# Patient Record
Sex: Female | Born: 1935 | Race: White | Hispanic: No | Smoking: Never smoker
Health system: Southern US, Community
[De-identification: ages and names within clinical notes are randomized; demographics above are authoritative.]

## PROBLEM LIST (undated history)

## (undated) DIAGNOSIS — F0280 Dementia in other diseases classified elsewhere without behavioral disturbance: Secondary | ICD-10-CM

## (undated) DIAGNOSIS — G309 Alzheimer's disease, unspecified: Secondary | ICD-10-CM

## (undated) DIAGNOSIS — I10 Essential (primary) hypertension: Secondary | ICD-10-CM

## (undated) HISTORY — PX: HIP SURGERY: SHX245

## (undated) HISTORY — PX: KNEE SURGERY: SHX244

---

## 2011-03-10 DEATH — deceased

## 2015-08-30 ENCOUNTER — Emergency Department (HOSPITAL_BASED_OUTPATIENT_CLINIC_OR_DEPARTMENT_OTHER): Payer: Medicare Other

## 2015-08-30 ENCOUNTER — Emergency Department (HOSPITAL_BASED_OUTPATIENT_CLINIC_OR_DEPARTMENT_OTHER)
Admission: EM | Admit: 2015-08-30 | Discharge: 2015-08-30 | Disposition: A | Discharge status: Home / Self Care | Payer: Medicare Other | Attending: Emergency Medicine | Admitting: Emergency Medicine

## 2015-08-30 ENCOUNTER — Encounter (HOSPITAL_BASED_OUTPATIENT_CLINIC_OR_DEPARTMENT_OTHER): Payer: Self-pay

## 2015-08-30 DIAGNOSIS — Z79899 Other long term (current) drug therapy: Secondary | ICD-10-CM | POA: Insufficient documentation

## 2015-08-30 DIAGNOSIS — S79921A Unspecified injury of right thigh, initial encounter: Secondary | ICD-10-CM | POA: Insufficient documentation

## 2015-08-30 DIAGNOSIS — Y9389 Activity, other specified: Secondary | ICD-10-CM | POA: Insufficient documentation

## 2015-08-30 DIAGNOSIS — G309 Alzheimer's disease, unspecified: Secondary | ICD-10-CM | POA: Insufficient documentation

## 2015-08-30 DIAGNOSIS — Z7982 Long term (current) use of aspirin: Secondary | ICD-10-CM | POA: Diagnosis not present

## 2015-08-30 DIAGNOSIS — W19XXXA Unspecified fall, initial encounter: Secondary | ICD-10-CM

## 2015-08-30 DIAGNOSIS — W1839XA Other fall on same level, initial encounter: Secondary | ICD-10-CM | POA: Diagnosis not present

## 2015-08-30 DIAGNOSIS — Y9289 Other specified places as the place of occurrence of the external cause: Secondary | ICD-10-CM | POA: Diagnosis not present

## 2015-08-30 DIAGNOSIS — Y998 Other external cause status: Secondary | ICD-10-CM | POA: Insufficient documentation

## 2015-08-30 DIAGNOSIS — S59911A Unspecified injury of right forearm, initial encounter: Secondary | ICD-10-CM | POA: Diagnosis present

## 2015-08-30 DIAGNOSIS — I10 Essential (primary) hypertension: Secondary | ICD-10-CM | POA: Insufficient documentation

## 2015-08-30 HISTORY — DX: Essential (primary) hypertension: I10

## 2015-08-30 HISTORY — DX: Dementia in other diseases classified elsewhere, unspecified severity, without behavioral disturbance, psychotic disturbance, mood disturbance, and anxiety: F02.80

## 2015-08-30 HISTORY — DX: Alzheimer's disease, unspecified: G30.9

## 2015-08-30 NOTE — ED Provider Notes (Signed)
CSN: [id number]     Arrival date & time 08/30/15  1135 History   First MD Initiated Contact with Patient 08/30/15 1203     Chief Complaint  Patient presents with  . Fall     (Consider location/radiation/quality/duration/timing/severity/associated sxs/prior Treatment) HPI Patient presents with caregiver who assists with the history of present illness. The patient has dementia, level V caveat. It seems as though the patient had a mechanical fall 3 days ago. Since that time she has had pain persistently in the right forearm, right mid thigh. No left-sided pain. No loss of consciousness at the time, nor change in interactivity since the fall. Patient denies any head pain, neck pain, states that the pain in her arm and leg are mild, persistent, sore. No medication taken for pain relief thus far.  Past Medical History  Diagnosis Date  . Alzheimer disease   . Hypertension    Past Surgical History  Procedure Laterality Date  . Hip surgery    . Knee surgery     No family history on file. Social History  Substance Use Topics  . Smoking status: Never Smoker   . Smokeless tobacco: None  . Alcohol Use: No   OB History    No data available     Review of Systems  Unable to perform ROS: Dementia      Allergies  Review of patient's allergies indicates no known allergies.  Home Medications   Prior to Admission medications   Medication Sig Start Date End Date Taking? Authorizing Provider  amLODipine (NORVASC) 2.5 MG tablet Take 2.5 mg by mouth daily.   Yes Historical Provider, MD  aspirin 325 MG tablet Take 325 mg by mouth daily.   Yes Historical Provider, MD  calcium carbonate (OSCAL) 1500 (600 CA) MG TABS tablet Take by mouth 2 (two) times daily with a meal.   Yes Historical Provider, MD  risperiDONE (RISPERDAL) 0.5 MG tablet Take 0.5 mg by mouth at bedtime.   Yes Historical Provider, MD   BP 109/60 mmHg  Pulse 68  Temp(Src) 97.6 F (36.4 C) (Oral)  Resp 18  Ht 5\' 4"   (1.626 m)  Wt 120 lb (54.432 kg)  BMI 20.59 kg/m2  SpO2 100% Physical Exam  Constitutional: She appears well-developed and well-nourished. No distress.  HENT:  Head: Normocephalic and atraumatic.  Eyes: Conjunctivae and EOM are normal.  Cardiovascular: Normal rate and regular rhythm.   Pulmonary/Chest: Effort normal and breath sounds normal. No stridor. No respiratory distress.  Abdominal: She exhibits no distension.  Musculoskeletal: She exhibits no edema.       Right elbow: Normal.      Right wrist: Normal.       Right hip: Normal.       Arms:      Legs: Neurological: She is alert. She displays atrophy. She displays no tremor. No cranial nerve deficit. She exhibits normal muscle tone. She displays no seizure activity.  Skin: Skin is warm and dry.  Psychiatric: Her speech is delayed. She is slowed and withdrawn. Cognition and memory are impaired.  Nursing note and vitals reviewed.   ED Course  Procedures (including critical care time) Labs Review Labs Reviewed - No data to display  Imaging Review Dg Forearm Right  08/30/2015  CLINICAL DATA:  Larey SeatFell 4 days ago.  Generalized pain. EXAM: RIGHT FOREARM - 2 VIEW COMPARISON:  None. FINDINGS: There is no evidence of fracture or other focal bone lesions. Soft tissues are unremarkable. IMPRESSION: Negative. Electronically Signed  By: Paulina Fusi M.D.   On: 08/30/2015 13:25   Dg Knee Complete 4 Views Right  08/30/2015  CLINICAL DATA:  Pain following fall 4 days prior EXAM: RIGHT KNEE - COMPLETE 4+ VIEW COMPARISON:  None. FINDINGS: Frontal, lateral, and bilateral oblique views were obtained. Patient is status post total knee replacement with the prosthetic components appearing well-seated. No acute fracture or dislocation. No appreciable joint effusion. 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 There is also scattered atherosclerotic vascular calcification present. IMPRESSION: Status post total knee replacement with  prosthetic components appearing well-seated. No acute fracture or dislocation. No joint effusion. 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 Electronically Signed   By: Bretta Bang III M.D.   On: 08/30/2015 13:28   Dg Hip Unilat With Pelvis 2-3 Views Right  08/30/2015  CLINICAL DATA:  Larey Seat 4 days ago with right hip pain. EXAM: DG HIP (WITH OR WITHOUT PELVIS) 2-3V RIGHT COMPARISON:  None. FINDINGS: Previous total hip replacement on the right. Components appear well positioned. No complications seen relating to that. The other bones of the pelvis do not show any acute injury. There is some osteoarthritis of the left hip. IMPRESSION: No acute or traumatic finding. Previous hip replacement on the right. Some osteoarthritis of the left hip. Electronically Signed   By: Paulina Fusi M.D.   On: 08/30/2015 13:26   I have personally reviewed and evaluated these images and lab results as part of my medical decision-making.  On repeat exam the patient is in no distress. I discussed all findings with patient and her caregiver.  MDM  Impression: Several days after a fall. Patient has had pain in multiple areas, but no appreciable deformity, and x-rays do not demonstrate fracture. Patient is hemodynamically stable, interacting at baseline per Patient discharged in stable condition to follow-up with primary care.  Gerhard Munch, MD 08/30/15 [phone]

## 2015-08-30 NOTE — ED Notes (Signed)
Lost balance and fell on Saturday-pain to right leg and right arm-pt presents to triage in w/c

## 2015-08-30 NOTE — Discharge Instructions (Signed)
As discussed, your evaluation today has been largely reassuring.  But, it is important that you monitor your condition carefully, and do not hesitate to return to the ED if you develop new, or concerning changes in your condition. ? ?Otherwise, please follow-up with your physician for appropriate ongoing care. ? ?

## 2016-01-10 ENCOUNTER — Emergency Department (HOSPITAL_BASED_OUTPATIENT_CLINIC_OR_DEPARTMENT_OTHER): Payer: Medicare Other

## 2016-01-10 ENCOUNTER — Encounter (HOSPITAL_BASED_OUTPATIENT_CLINIC_OR_DEPARTMENT_OTHER): Payer: Self-pay | Admitting: Emergency Medicine

## 2016-01-10 ENCOUNTER — Emergency Department (HOSPITAL_BASED_OUTPATIENT_CLINIC_OR_DEPARTMENT_OTHER)
Admission: EM | Admit: 2016-01-10 | Discharge: 2016-01-10 | Disposition: A | Discharge status: Home / Self Care | Payer: Medicare Other | Attending: Emergency Medicine | Admitting: Emergency Medicine

## 2016-01-10 DIAGNOSIS — N390 Urinary tract infection, site not specified: Secondary | ICD-10-CM | POA: Diagnosis not present

## 2016-01-10 DIAGNOSIS — R4182 Altered mental status, unspecified: Secondary | ICD-10-CM | POA: Diagnosis present

## 2016-01-10 DIAGNOSIS — Z7982 Long term (current) use of aspirin: Secondary | ICD-10-CM | POA: Insufficient documentation

## 2016-01-10 DIAGNOSIS — G309 Alzheimer's disease, unspecified: Secondary | ICD-10-CM | POA: Diagnosis not present

## 2016-01-10 DIAGNOSIS — Z79899 Other long term (current) drug therapy: Secondary | ICD-10-CM | POA: Insufficient documentation

## 2016-01-10 DIAGNOSIS — I10 Essential (primary) hypertension: Secondary | ICD-10-CM | POA: Insufficient documentation

## 2016-01-10 DIAGNOSIS — L03115 Cellulitis of right lower limb: Secondary | ICD-10-CM | POA: Diagnosis not present

## 2016-01-10 DIAGNOSIS — F0280 Dementia in other diseases classified elsewhere without behavioral disturbance: Secondary | ICD-10-CM | POA: Insufficient documentation

## 2016-01-10 LAB — CBC WITH DIFFERENTIAL/PLATELET
Basophils Absolute: 0 10*3/uL (ref 0.0–0.1)
Basophils Relative: 1 %
EOS ABS: 0.2 10*3/uL (ref 0.0–0.7)
EOS PCT: 4 %
HCT: 31.2 % — ABNORMAL LOW (ref 36.0–46.0)
HEMOGLOBIN: 10.4 g/dL — AB (ref 12.0–15.0)
LYMPHS ABS: 1.3 10*3/uL (ref 0.7–4.0)
LYMPHS PCT: 25 %
MCH: 30.1 pg (ref 26.0–34.0)
MCHC: 33.3 g/dL (ref 30.0–36.0)
MCV: 90.4 fL (ref 78.0–100.0)
MONOS PCT: 14 %
Monocytes Absolute: 0.8 10*3/uL (ref 0.1–1.0)
NEUTROS PCT: 56 %
Neutro Abs: 3 10*3/uL (ref 1.7–7.7)
Platelets: 329 10*3/uL (ref 150–400)
RBC: 3.45 MIL/uL — ABNORMAL LOW (ref 3.87–5.11)
RDW: 16.7 % — ABNORMAL HIGH (ref 11.5–15.5)
WBC: 5.4 10*3/uL (ref 4.0–10.5)

## 2016-01-10 LAB — COMPREHENSIVE METABOLIC PANEL
ALT: 27 U/L (ref 14–54)
AST: 23 U/L (ref 15–41)
Albumin: 3.3 g/dL — ABNORMAL LOW (ref 3.5–5.0)
Alkaline Phosphatase: 79 U/L (ref 38–126)
Anion gap: 7 (ref 5–15)
BUN: 21 mg/dL — AB (ref 6–20)
CHLORIDE: 104 mmol/L (ref 101–111)
CO2: 25 mmol/L (ref 22–32)
CREATININE: 0.52 mg/dL (ref 0.44–1.00)
Calcium: 8.8 mg/dL — ABNORMAL LOW (ref 8.9–10.3)
GFR calc Af Amer: >60 mL/min (ref >60)
GFR calc non Af Amer: >60 mL/min (ref >60)
Glucose, Bld: 109 mg/dL — ABNORMAL HIGH (ref 65–99)
POTASSIUM: 4 mmol/L (ref 3.5–5.1)
SODIUM: 136 mmol/L (ref 135–145)
Total Bilirubin: 0.4 mg/dL (ref 0.3–1.2)
Total Protein: 6.2 g/dL — ABNORMAL LOW (ref 6.5–8.1)

## 2016-01-10 LAB — URINALYSIS, ROUTINE W REFLEX MICROSCOPIC
Bilirubin Urine: NEGATIVE
Glucose, UA: NEGATIVE mg/dL
Hgb urine dipstick: NEGATIVE
Ketones, ur: NEGATIVE mg/dL
NITRITE: POSITIVE — AB
PROTEIN: NEGATIVE mg/dL
Specific Gravity, Urine: 1.022 (ref 1.005–1.030)
pH: 6 (ref 5.0–8.0)

## 2016-01-10 LAB — URINE MICROSCOPIC-ADD ON

## 2016-01-10 MED ORDER — CEFTRIAXONE SODIUM 1 G IJ SOLR: 1.0000 g | Freq: Once | INTRAMUSCULAR | Status: DC

## 2016-01-10 MED ORDER — DEXTROSE 5 % IV SOLN
1.0000 g | Freq: Once | INTRAVENOUS | Status: AC
  Administered 2016-01-10: 1 g via INTRAVENOUS
  Filled 2016-01-10: qty 10

## 2016-01-10 MED ORDER — CEPHALEXIN 500 MG PO CAPS: 500.0000 mg | ORAL_CAPSULE | Freq: Two times a day (BID) | ORAL | Status: AC

## 2016-01-10 NOTE — ED Notes (Signed)
Attempts x3 to call report [facility] at [phone] with no answer call to second [facility] and message with return number to call left with Bonita QuinLinda who stated she would continue to call [facility] and have them return call to [facility] at [phone].

## 2016-01-10 NOTE — ED Notes (Signed)
[facility] contacted and report given.

## 2016-01-10 NOTE — ED Notes (Signed)
Pt brought to ED via EMS from NH for R/O UTI. Has had increased confusion x 3 days. Has been combative at times per NH staff and has had increased falls. Presents responding approp to some questions, but not oriented to date and time. Oriented to birthday and age and some of her hx. Bruising noted to left hip and lower ext. Abrasions to both LE. Approx 3 cm healing wound to RLE. And two approx 2 cm healing wounds to LLE.

## 2016-01-10 NOTE — ED Notes (Signed)
While getting vitals patient is confused and not following directions. Patient started to take temperature then pushed thermometer out of mouth and states that is enough of that.

## 2016-01-10 NOTE — ED Notes (Signed)
Per EMS:  Pt at Nursing facility.   Staff states pt has increase in confusion, some combativeness and increase in falls over the last three days.  Staff requesting assessment for UTI evaluation.  Pt has several skin tears from her recent falls.  Pt argumentative, refusing care.

## 2016-01-10 NOTE — ED Provider Notes (Signed)
CSN: [id number]     Arrival date & time 01/10/16  1829 History  By signing my name below, I, Linna Darner, attest that this documentation has been prepared under the direction and in the presence of physician practitioner, Pricilla Loveless, MD. Electronically Signed: Linna Darner, Scribe. 01/10/2016. 6:50 PM.    Chief Complaint  Patient presents with  . Altered Mental Status    The history is provided by the patient and the EMS personnel. No language interpreter was used.     HPI Comments: LEVEL 5 CAVEAT DUE TO DEMENTIA Alyssa Turner is a 82 y.o. female brought in by EMS with h/o Alzheimer's who presents to the Emergency Department with concern for increased confusion and falls over the last three days. Per EMS, pt was sent to the ER today for a UTI evaluation after growing concern over her increased recent falls at her nursing home. Per EMS, pt has multiple skin tears and abrasions from her recent falls. She endorses mild lower leg pain bilaterally. Pt denies abdominal pain, neck pain, or any other associated symptoms.  Past Medical History  Diagnosis Date  . Alzheimer disease   . Hypertension    Past Surgical History  Procedure Laterality Date  . Hip surgery    . Knee surgery     No family history on file. Social History  Substance Use Topics  . Smoking status: Never Smoker   . Smokeless tobacco: None  . Alcohol Use: No   OB History    No data available     Review of Systems  Unable to perform ROS: Dementia  Musculoskeletal: Positive for arthralgias. Negative for neck pain.  All other systems reviewed and are negative.  Allergies  Review of patient's allergies indicates no known allergies.  Home Medications   Prior to Admission medications   Medication Sig Start Date End Date Taking? Authorizing Provider  amLODipine (NORVASC) 2.5 MG tablet Take 2.5 mg by mouth daily.   Yes Historical Provider, MD  aspirin 325 MG tablet Take 325 mg by mouth daily.   Yes Historical  Provider, MD  Calcium Carbonate-Vit D-Min (CALTRATE 600+D PLUS MINERALS) 600-800 MG-UNIT CHEW Chew 1 tablet by mouth 2 (two) times daily.   Yes Historical Provider, MD  diphenhydrAMINE (BENADRYL) 25 MG tablet Take 50 mg by mouth at bedtime as needed.   Yes Historical Provider, MD  donepezil (ARICEPT) 10 MG tablet Take 10 mg by mouth daily.   Yes Historical Provider, MD  ibuprofen (ADVIL,MOTRIN) 200 MG tablet Take 400 mg by mouth 2 (two) times daily as needed.   Yes Historical Provider, MD  loperamide (IMODIUM A-D) 2 MG tablet Take 2 mg by mouth 4 (four) times daily as needed for diarrhea or loose stools.   Yes Historical Provider, MD  Multiple Vitamins-Minerals (ICAPS MV) TABS Take 1 tablet by mouth daily.   Yes Historical Provider, MD  neomycin-bacitracin-polymyxin (NEOSPORIN) [phone] ointment Apply 1 application topically 4 (four) times daily.   Yes Historical Provider, MD   BP 126/62 mmHg  Pulse 83  Temp(Src)   Resp 18  SpO2 100% Physical Exam  Constitutional: She appears well-developed and well-nourished.  HENT:  Head: Normocephalic and atraumatic.  Right Ear: External ear normal.  Left Ear: External ear normal.  Nose: Nose normal.  Eyes: Right eye exhibits no discharge. Left eye exhibits no discharge.  Cardiovascular: Normal rate, regular rhythm and normal heart sounds.   Pulmonary/Chest: Effort normal and breath sounds normal.  Abdominal: Soft. There is no tenderness.  Musculoskeletal:  Ecchymosis over left proximal lateral leg with tenderness. Multiple bruises and small abrasions to bilateral lower extremities. RLE: area of redness, warmth, and superficial wound to the lower leg; no drainage.  Neurological: She is alert.  Disoriented. Awake and alert to self and place. 5/5 strength in all four extremities.  Skin: Skin is warm and dry.  Nursing note and vitals reviewed.   ED Course  Procedures (including critical care time)  DIAGNOSTIC STUDIES: Oxygen Saturation is  100% on RA, normal by my interpretation.    COORDINATION OF CARE: 6:50 PM Discussed treatment plan with pt at bedside and pt agreed to plan.  Labs Review Labs Reviewed  URINALYSIS, ROUTINE W REFLEX MICROSCOPIC (NOT AT [facility]) - Abnormal; Notable for the following:    APPearance CLOUDY (*)    Nitrite POSITIVE (*)    Leukocytes, UA SMALL (*)    All other components within normal limits  COMPREHENSIVE METABOLIC PANEL - Abnormal; Notable for the following:    Glucose, Bld 109 (*)    BUN 21 (*)    Calcium 8.8 (*)    Total Protein 6.2 (*)    Albumin 3.3 (*)    All other components within normal limits  CBC WITH DIFFERENTIAL/PLATELET - Abnormal; Notable for the following:    RBC 3.45 (*)    Hemoglobin 10.4 (*)    HCT 31.2 (*)    RDW 16.7 (*)    All other components within normal limits  URINE MICROSCOPIC-ADD ON - Abnormal; Notable for the following:    Squamous Epithelial / LPF 0-5 (*)    Bacteria, UA MANY (*)    All other components within normal limits  URINE CULTURE    Imaging Review Dg Chest 2 View  01/10/2016  CLINICAL DATA:  82 year old with current history of Alzheimer's dementia presenting with worsening confusion and multiple falls over the past 3 days. EXAM: CHEST  2 VIEW COMPARISON:  None. FINDINGS: Cardiac silhouette moderately enlarged. Thoracic aorta atherosclerotic. Large hiatal hernia. Lungs clear. Bronchovascular markings normal. Pulmonary vascularity normal. No visible pleural effusions. No pneumothorax. Degenerative changes and DISH involving the thoracic spine. Old healed right rib fractures. Severe degenerative changes involving the visualized shoulder joints. IMPRESSION: Cardiomegaly. No acute cardiopulmonary disease. Large hiatal hernia. Electronically Signed   By: Hulan Saashomas  Lawrence M.D.   On: 01/10/2016 20:53   Ct Head Wo Contrast  01/10/2016  CLINICAL DATA:  82 year old with current history of Alzheimer's dementia presenting with 3 day history of worsening confusion  multiple falls. EXAM: CT HEAD WITHOUT CONTRAST TECHNIQUE: Contiguous axial images were obtained from the base of the skull through the vertex without intravenous contrast. COMPARISON:  None. FINDINGS: Patient motion blurred acute the images but the study appears diagnostic. Severe cortical, deep and cerebellar atrophy. Moderate changes of small vessel disease of the white matter diffusely. No mass lesion. No midline shift. No acute hemorrhage or hematoma. No extra-axial fluid collections. No evidence of acute infarction. No skull fracture or other focal osseous abnormality involving the skull. Visualized paranasal sinuses, bilateral mastoid air cells and bilateral middle ear cavities well-aerated. Moderate bilateral carotid siphon atherosclerosis. IMPRESSION: 1. No acute intracranial abnormality. 2. Severe generalized atrophy and moderate chronic microvascular ischemic changes of the white matter. Electronically Signed   By: Hulan Saashomas  Lawrence M.D.   On: 01/10/2016 20:55   Dg Hip Unilat With Pelvis 2-3 Views Left  01/10/2016  CLINICAL DATA:  Left hip injury and urinary tract infection. Several falls over the past couple days. EXAM:  DG HIP (WITH OR WITHOUT PELVIS) 2-3V LEFT COMPARISON:  None. FINDINGS: Osteopenia. No definite displaced left hip fracture. Moderate degenerative change of the left hip with joint space loss, subchondral sclerosis and osteophytosis. Limited visualization of the adjacent pelvis is normal. Post right total hip replacement without evidence of hardware failure or loosening, though note, the femoral stem component tip is not imaged. Suspected at least moderate degenerative change of the lower lumbar spine. Vascular calcifications.  No radiopaque foreign body. IMPRESSION: 1. Osteopenia without definite displaced left hip fracture. 2. Moderate degenerate change of the left hip. Electronically Signed   By: Simonne Come M.D.   On: 01/10/2016 20:54   I have personally reviewed and evaluated these  images and lab results as part of my medical decision-making.   EKG Interpretation   Date/Time:  Wednesday Jan 10 2016 19:18:49 EDT Ventricular Rate:  71 PR Interval:  69 QRS Duration: 100 QT Interval:  417 QTC Calculation: 453 R Axis:     Text Interpretation:  Normal sinus rhythm wandering baseline limits  interpretation No old tracing to compare Confirmed by Quinnton Bury MD, Shaymus Eveleth  [phone]) on 01/10/2016 10:36:30 PM      MDM   Final diagnoses:  UTI (lower urinary tract infection)  Cellulitis of right leg    Patient vital signs are unremarkable here including no fever. When describing the patient's mental status to her son Trishelle Devora) over the phone, son states that that is pretty much her baseline. He is okay with her being discharged back to the facility. He relates that her right leg has had a chronic wound for at least a couple months. This may not be cellulitis at all. Patient is not very tender there. She does appear to have a UTI which likely causing her acute on chronic mental status changes. However no signs of sepsis. Thus will discharge back to the facility after being given IV Rocephin. Discharged on oral antibiotics.  I personally performed the services described in this documentation, which was scribed in my presence. The recorded information has been reviewed and is accurate.   Pricilla Loveless, MD 01/10/16 [phone]

## 2016-01-10 NOTE — ED Notes (Addendum)
[facility] called for transport. POA R. Earlene PlaterDavis called and updated to condition and notified of [facility].

## 2016-01-13 LAB — URINE CULTURE: SPECIAL REQUESTS: NORMAL

## 2016-01-14 ENCOUNTER — Telehealth (HOSPITAL_BASED_OUTPATIENT_CLINIC_OR_DEPARTMENT_OTHER): Payer: Self-pay

## 2016-01-14 NOTE — Telephone Encounter (Signed)
Post ED Visit - Positive Culture Follow-up  Culture report reviewed by antimicrobial stewardship pharmacist:  []  Enzo BiNathan Batchelder, Pharm.D. []  Celedonio MiyamotoJeremy Frens, Pharm.D., BCPS []  Garvin FilaMike Maccia, Pharm.D. []  Georgina PillionElizabeth Martin, Pharm.D., BCPS []  [place] Pham, [address]Ph[address]D., BCPS, AAHIVP []  Estella HuskMichelle Turner, Pharm.D., BCPS, AAHIVP []  Tennis Mustassie Stewart, [address]Pharm[address] []  Sherle Poeob Vincent, [place].  Positive urine culture Treated with Cephalexin, organism sensitive to the same and no further patient follow-up is required at this time.  Jerry CarasCullom, Gavin Faivre Burnett 01/14/2016, 11:26 AM

## 2016-07-10 DEATH — deceased

## 2017-08-13 IMAGING — CT CT HEAD W/O CM
1 series · 16 of 30 positions shown, 20 images · non-contrast
Comparison: None.

CLINICAL DATA: 89-year-old with current history of Alzheimer's
dementia presenting with 3 day history of worsening confusion
multiple falls.

EXAM:
CT HEAD WITHOUT CONTRAST
TECHNIQUE: Contiguous axial images were obtained from the base of the skull
through the vertex without intravenous contrast.

[Series 2: head wo · axial · 0.44mm/px · z∈[-140,+0]mm · 16 of 32 slices shown, 20 images]
[im 2/32  brain]
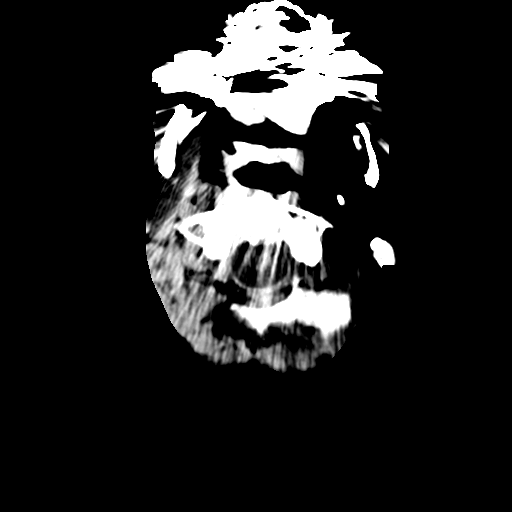
[im 2/32  bone]
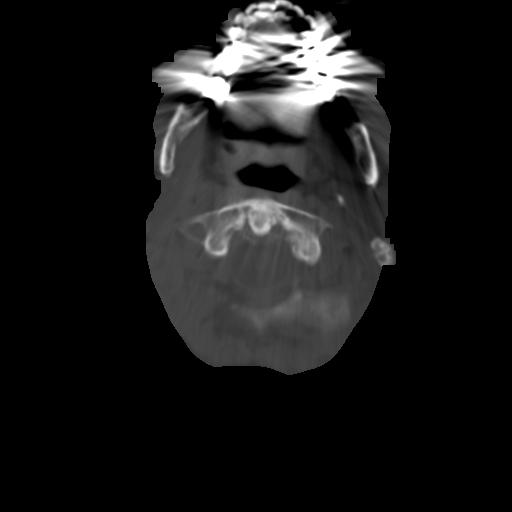
[im 4/32  brain]
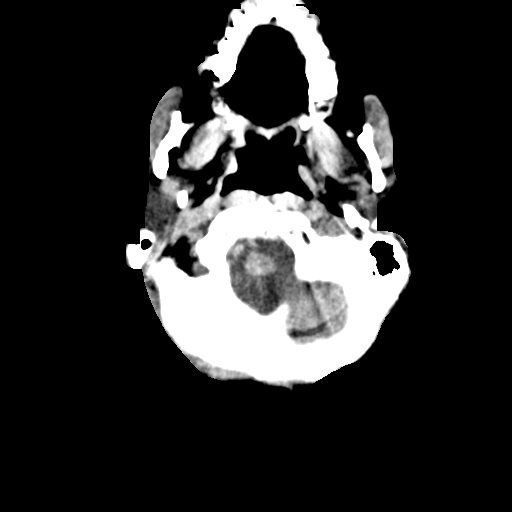
[im 6/32  brain]
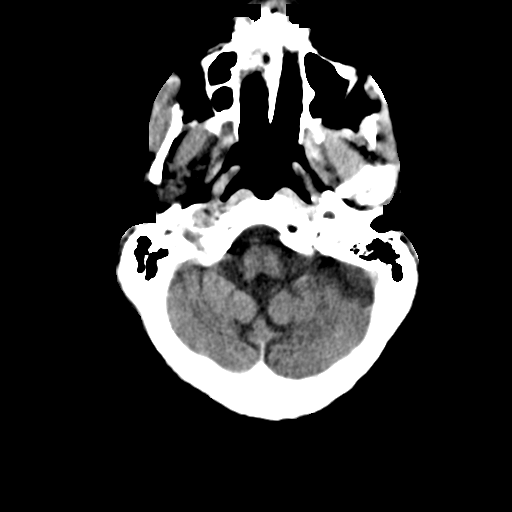
[im 8/32  brain]
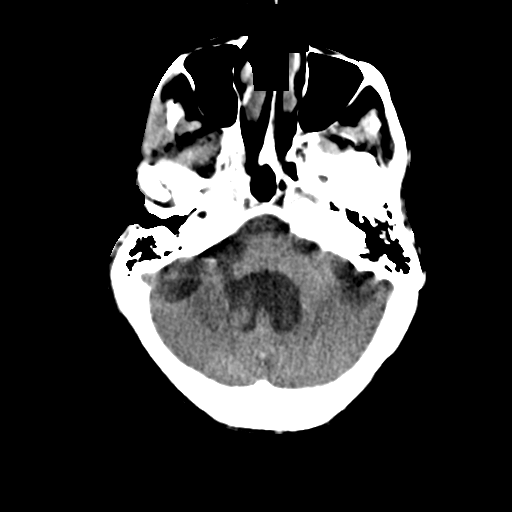
[im 9/32  brain]
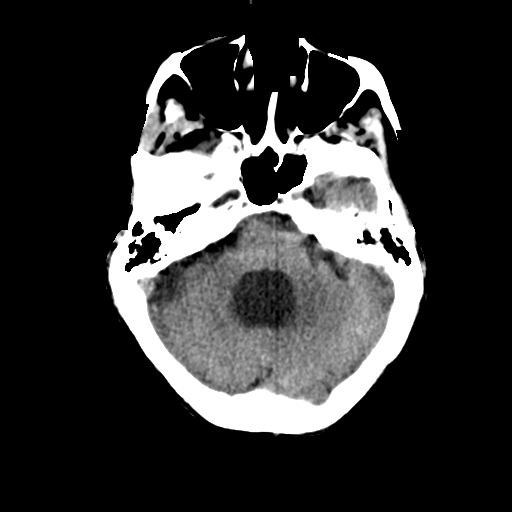
[im 9/32  bone]
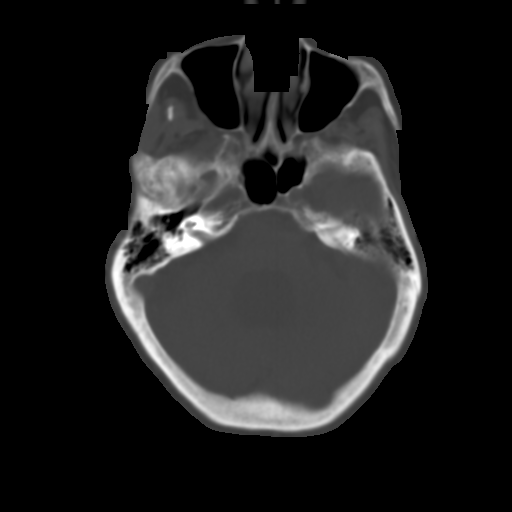
[im 11/32  brain]
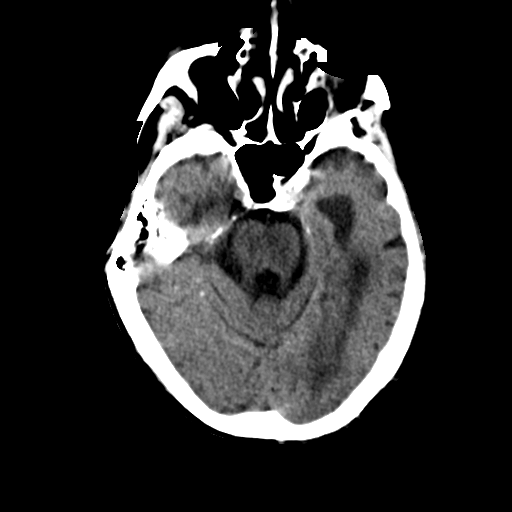
[im 13/32  brain]
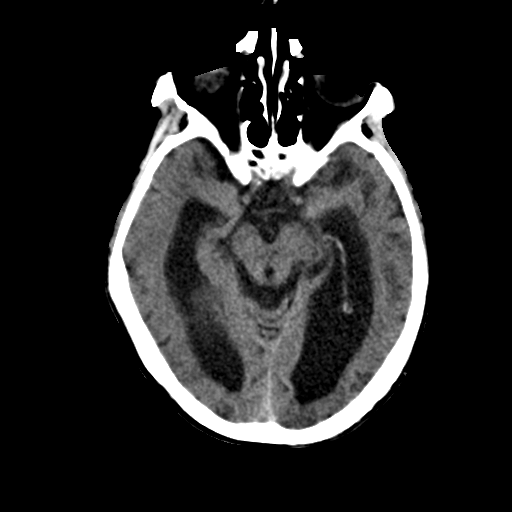
[im 15/32  brain]
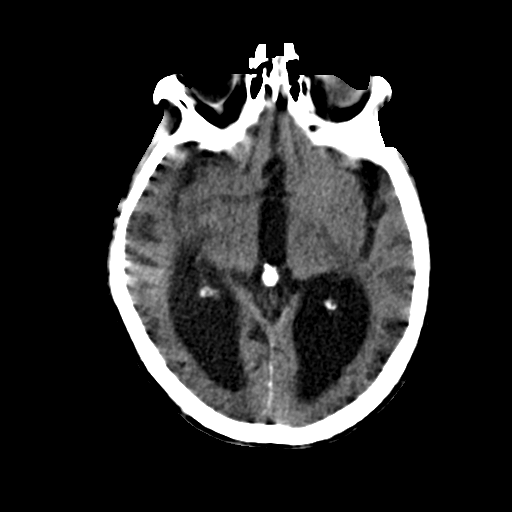
[im 17/32  brain]
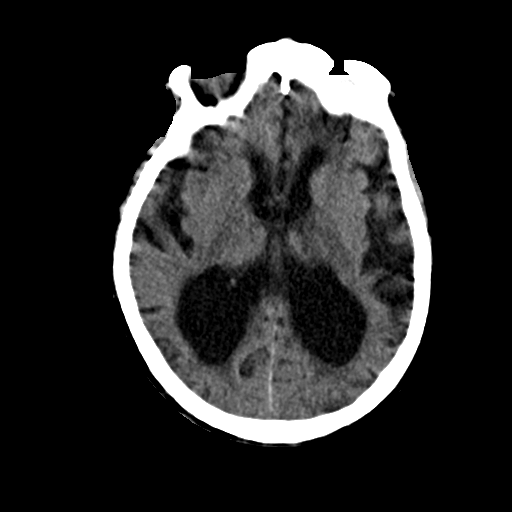
[im 17/32  bone]
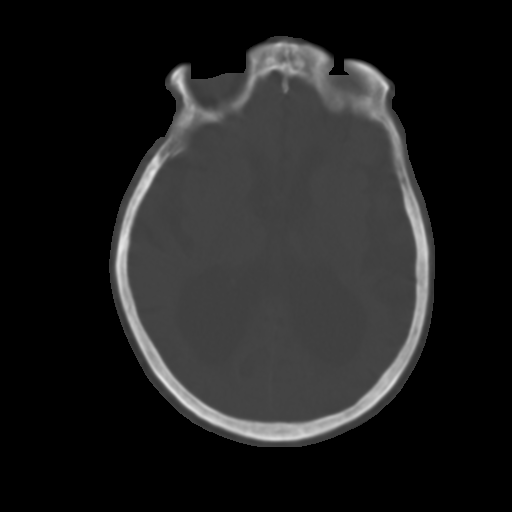
[im 19/32  brain]
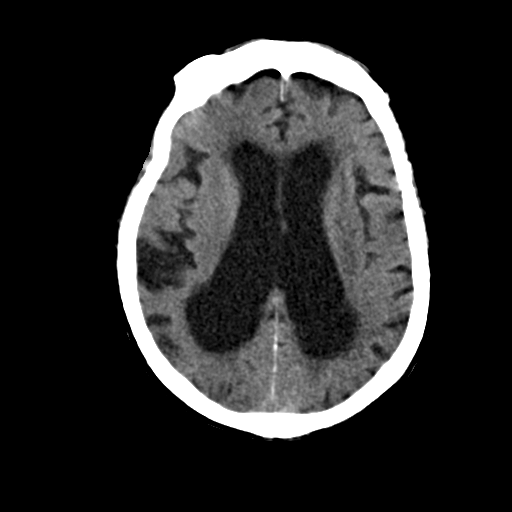
[im 21/32  brain]
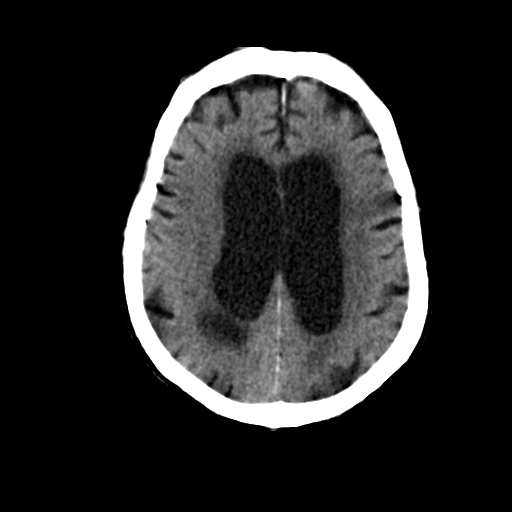
[im 23/32  brain]
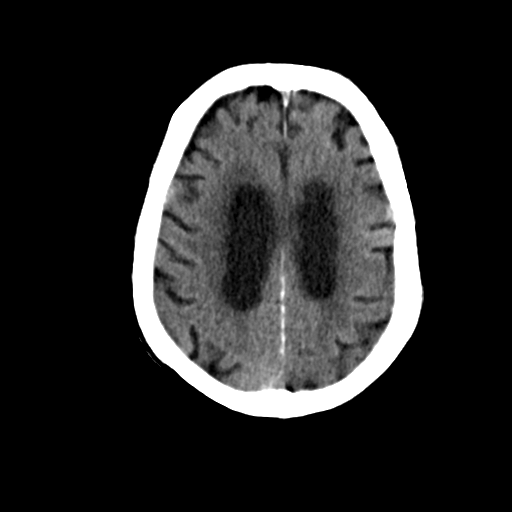
[im 24/32  brain]
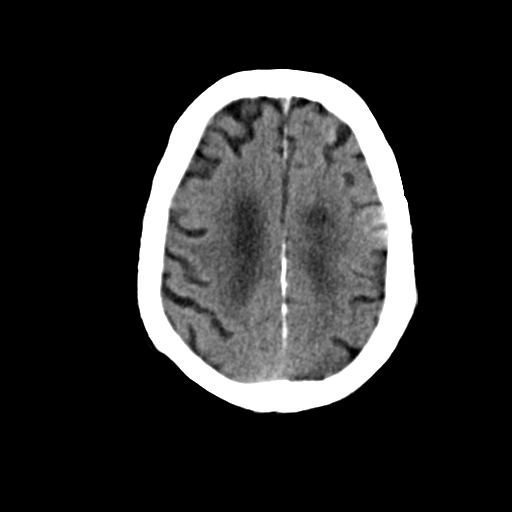
[im 24/32  bone]
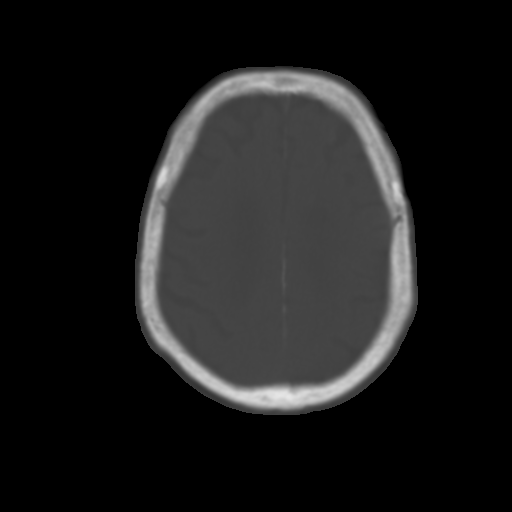
[im 26/32  brain]
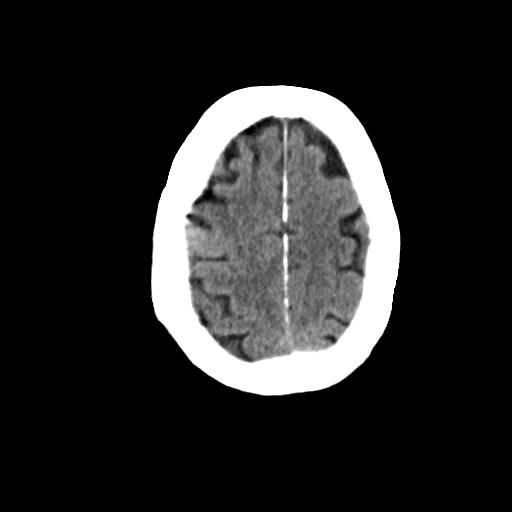
[im 28/32  brain]
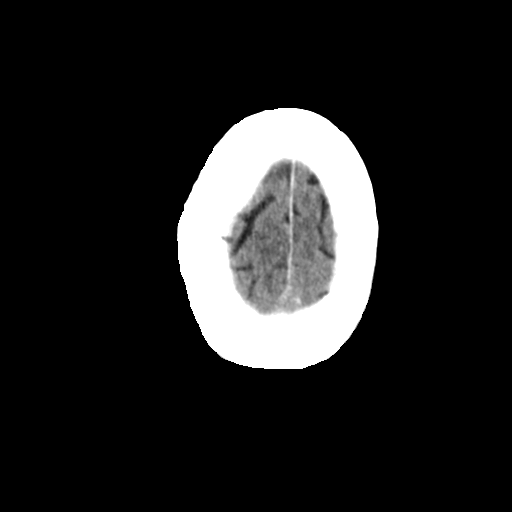
[im 30/32  brain]
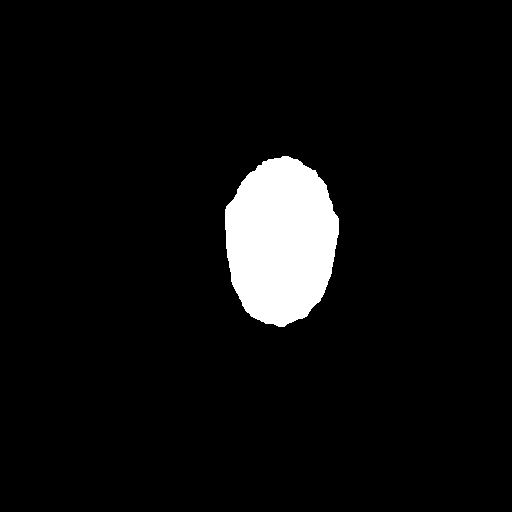

[16 of 30 positions shown; findings below may reference images not displayed]

FINDINGS: Patient motion blurred acute the images but the study appears
diagnostic. Severe cortical, deep and cerebellar atrophy. Moderate
changes of small vessel disease of the white matter diffusely. No
mass lesion. No midline shift. No acute hemorrhage or hematoma. No
extra-axial fluid collections. No evidence of acute infarction.

No skull fracture or other focal osseous abnormality involving the
skull. Visualized paranasal sinuses, bilateral mastoid air cells and
bilateral middle ear cavities well-aerated. Moderate bilateral
carotid siphon atherosclerosis.
IMPRESSION: 1. No acute intracranial abnormality.
2. Severe generalized atrophy and moderate chronic microvascular
ischemic changes of the white matter.

## 2017-08-13 IMAGING — DX DG CHEST 2V
2 series · 2 of 2 positions shown · non-contrast
Comparison: None.

CLINICAL DATA: 89-year-old with current history of Alzheimer's
dementia presenting with worsening confusion and multiple falls over
the past 3 days.

EXAM:
CHEST  2 VIEW

[chest lat]
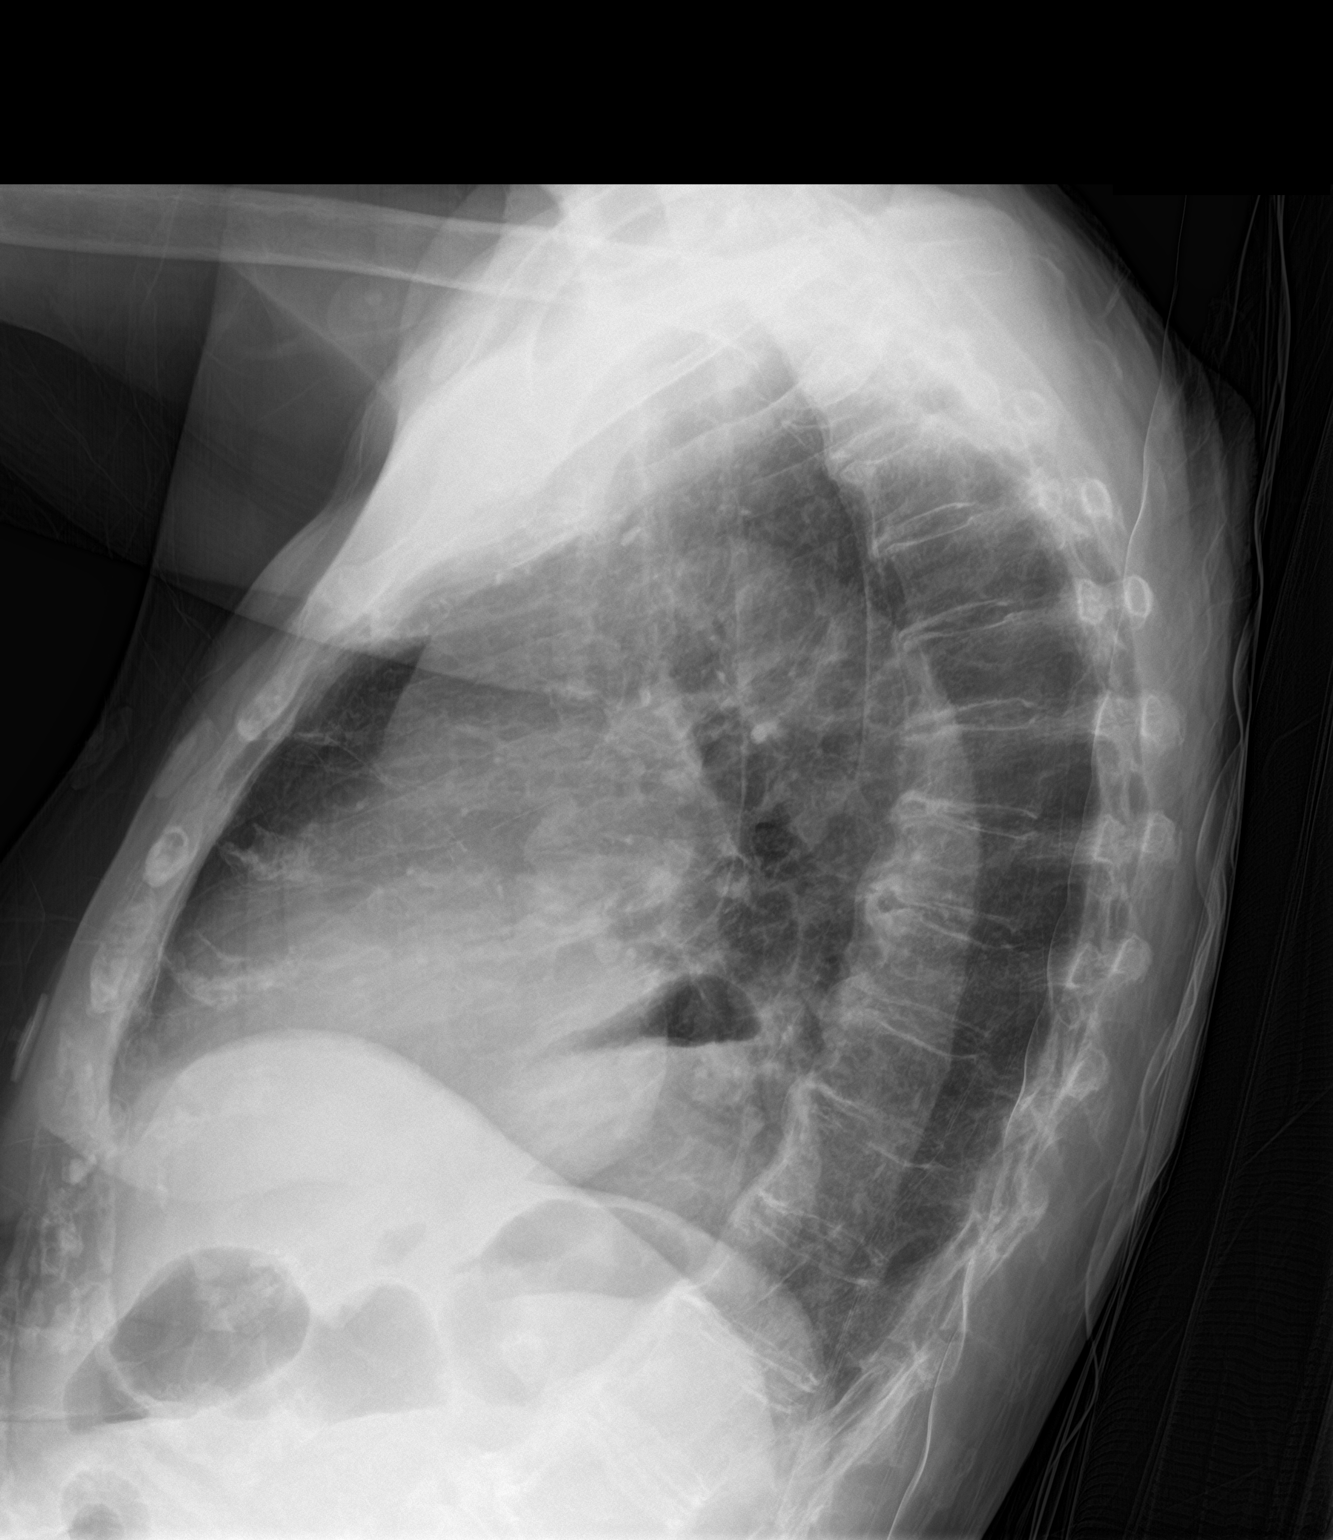

[chest ap]
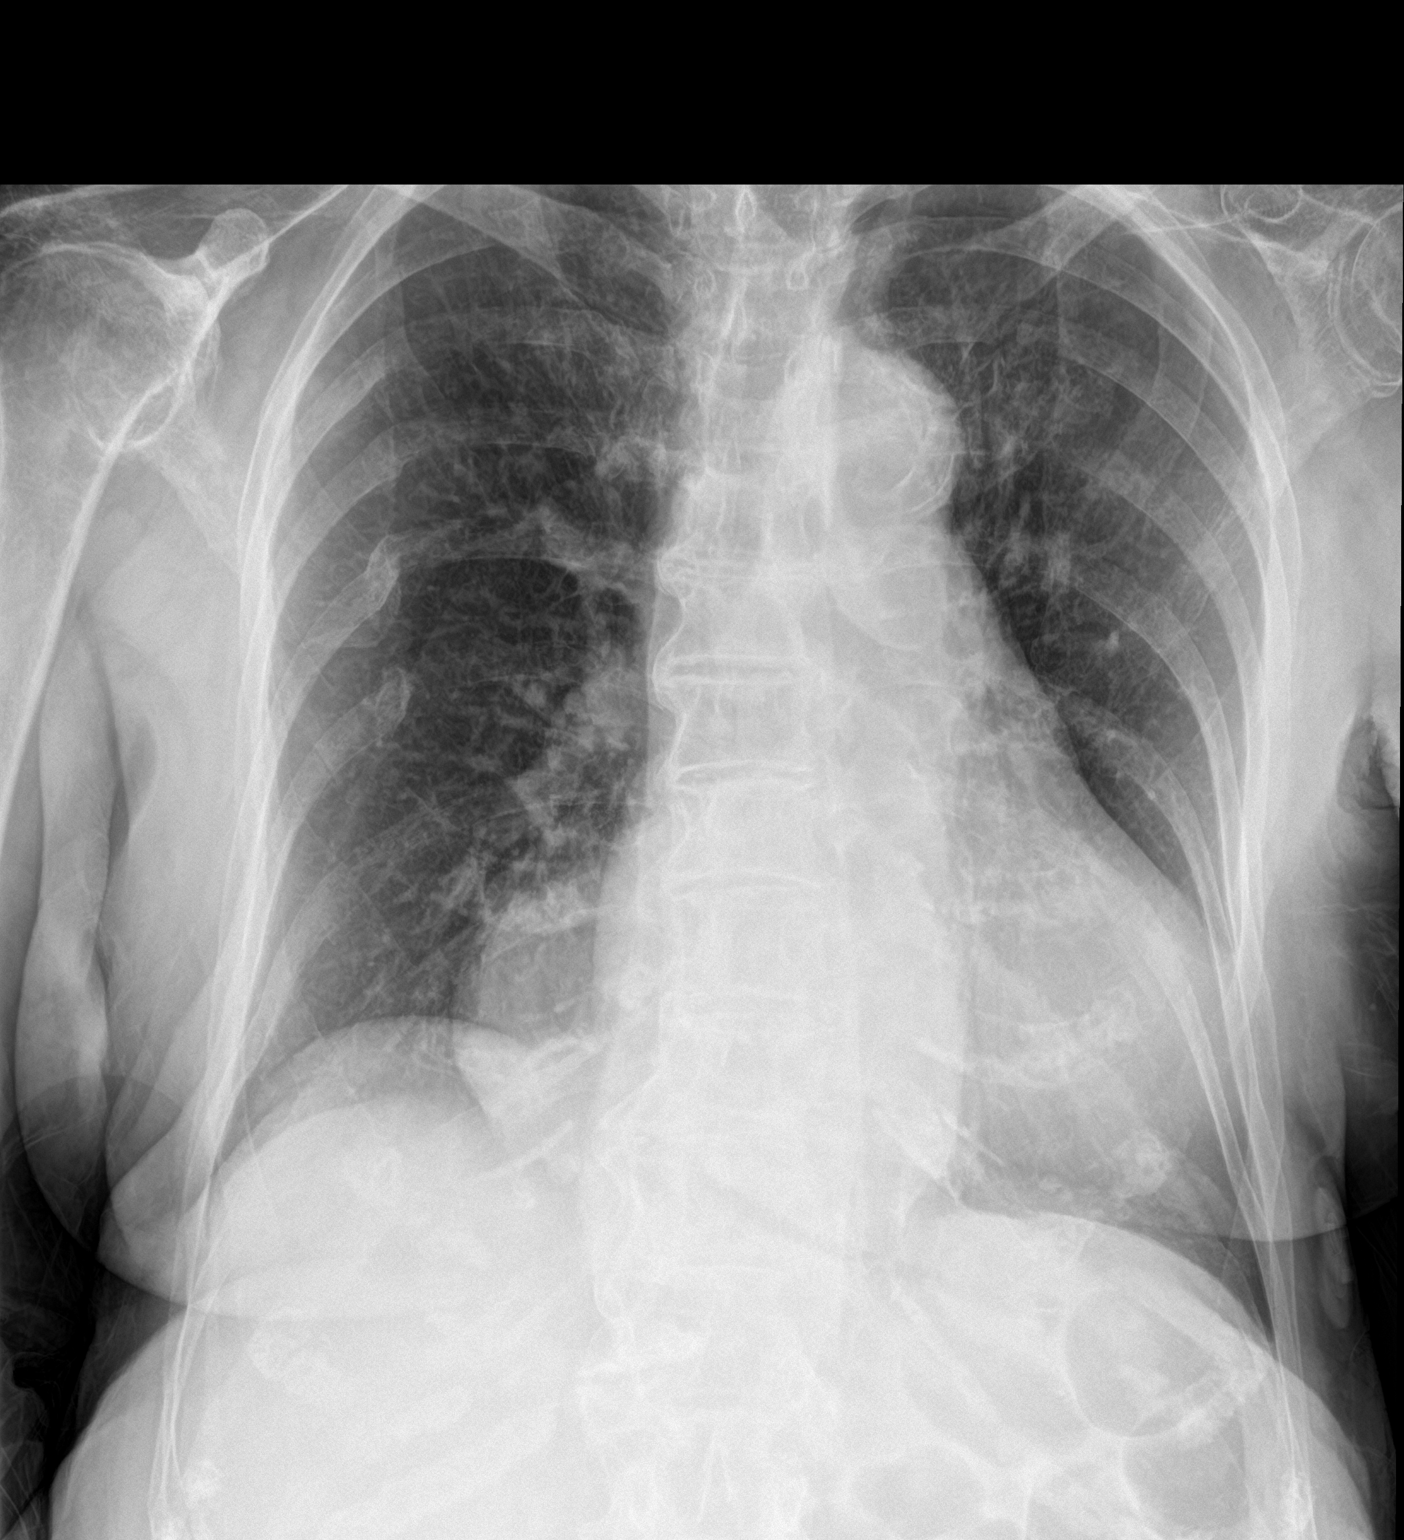

[2 of 2 positions shown; findings below may reference images not displayed]

FINDINGS: Cardiac silhouette moderately enlarged. Thoracic aorta
atherosclerotic. Large hiatal hernia. Lungs clear. Bronchovascular
markings normal. Pulmonary vascularity normal. No visible pleural
effusions. No pneumothorax. Degenerative changes and DISH involving
the thoracic spine. Old healed right rib fractures. Severe
degenerative changes involving the visualized shoulder joints.
IMPRESSION: Cardiomegaly. No acute cardiopulmonary disease. Large hiatal hernia.

## 2017-08-13 IMAGING — DX DG HIP (WITH OR WITHOUT PELVIS) 2-3V*L*
3 series · 3 of 3 positions shown · non-contrast
Comparison: None.

CLINICAL DATA: Left hip injury and urinary tract infection. Several
falls over the past couple days.

EXAM:
DG HIP (WITH OR WITHOUT PELVIS) 2-3V LEFT

[pelvis ap]
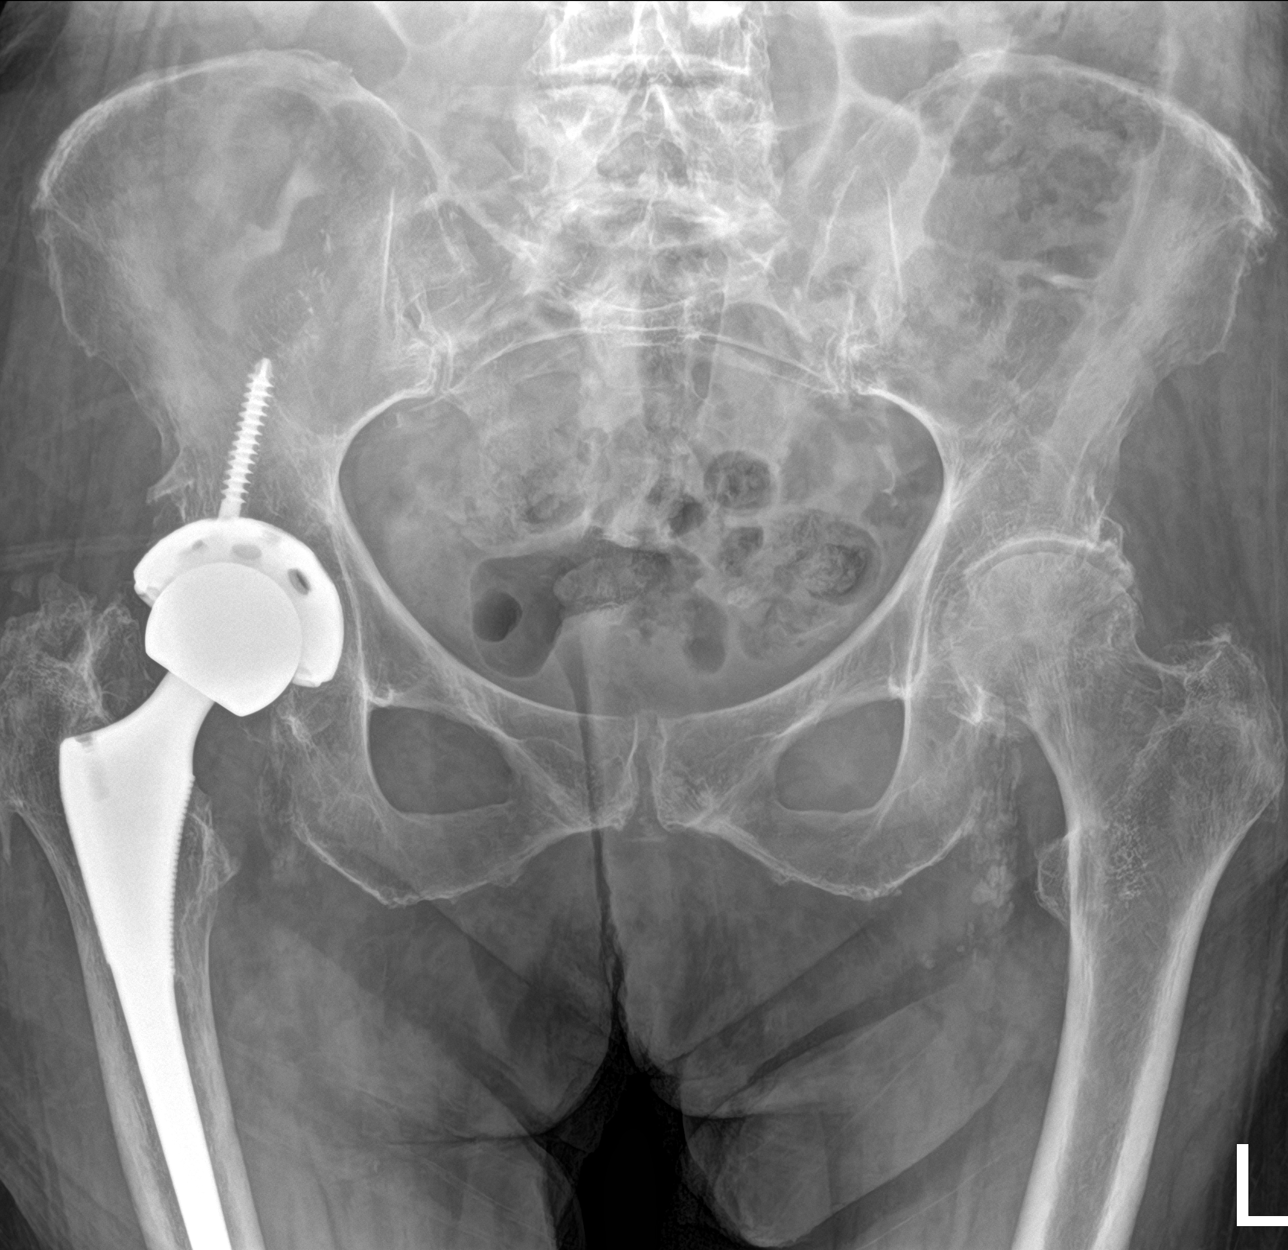

[hip ap]
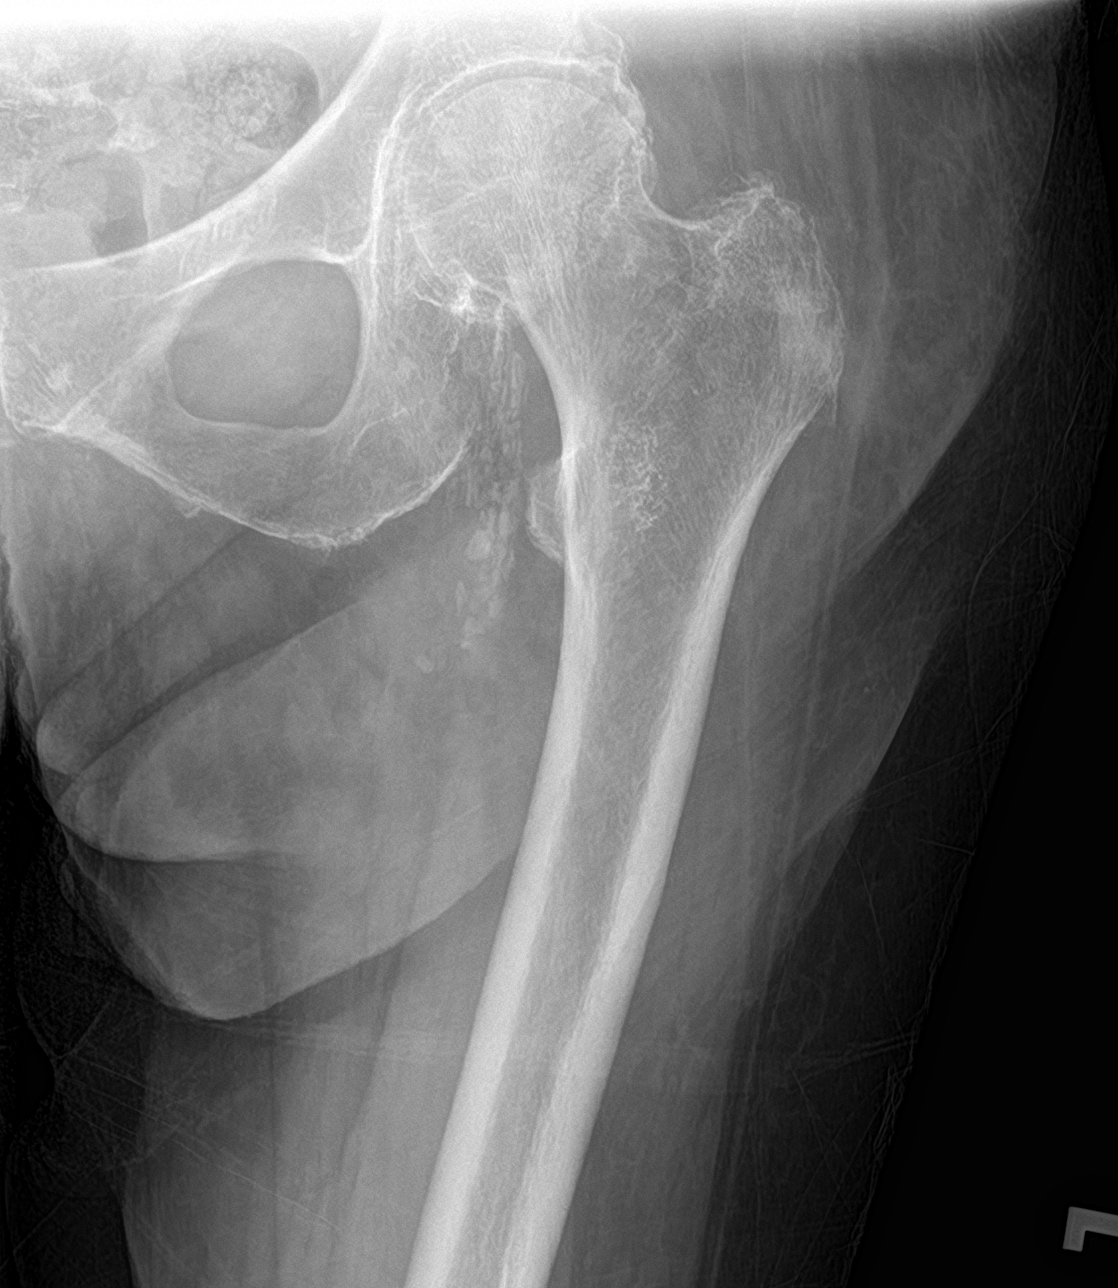

[hip lat]
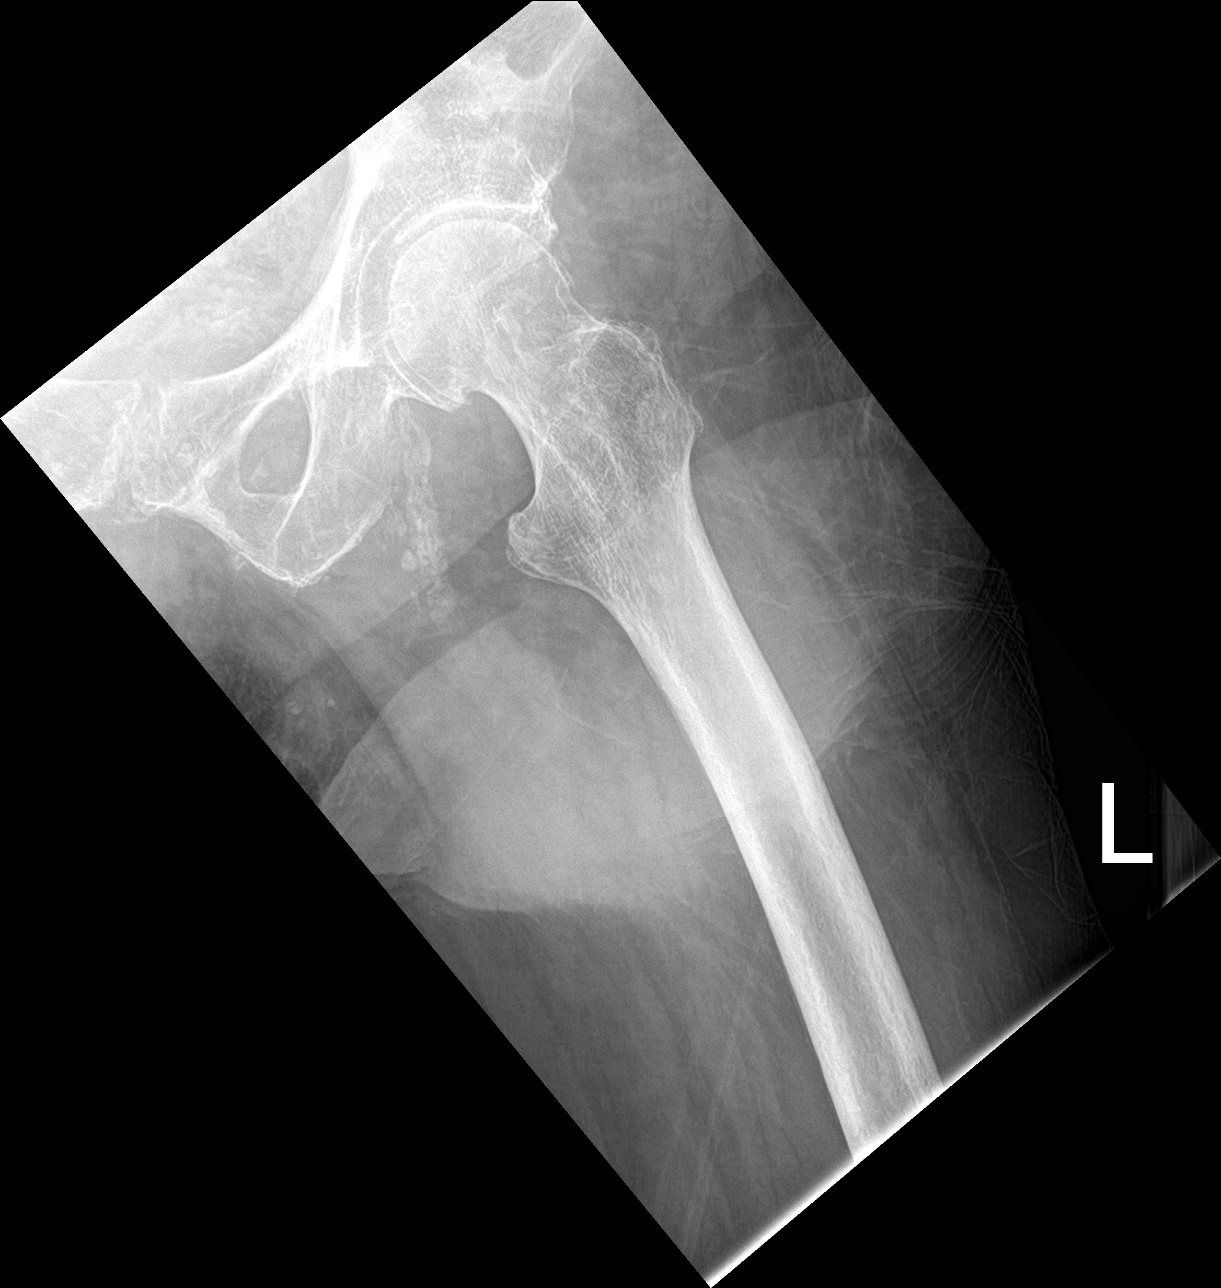

[3 of 3 positions shown; findings below may reference images not displayed]

FINDINGS: Osteopenia. No definite displaced left hip fracture. Moderate
degenerative change of the left hip with joint space loss,
subchondral sclerosis and osteophytosis.

Limited visualization of the adjacent pelvis is normal. Post right
total hip replacement without evidence of hardware failure or
loosening, though note, the femoral stem component tip is not
imaged. Suspected at least moderate degenerative change of the lower
lumbar spine.

Vascular calcifications.  No radiopaque foreign body.
IMPRESSION: 1. Osteopenia without definite displaced left hip fracture.
2. Moderate degenerate change of the left hip.
